# Patient Record
Sex: Male | Born: 1935 | Race: White | Hispanic: No | Marital: Married | Smoking: Never smoker
Health system: Southern US, Community
[De-identification: ages and names within clinical notes are randomized; demographics above are authoritative.]

## PROBLEM LIST (undated history)

## (undated) DIAGNOSIS — J3089 Other allergic rhinitis: Secondary | ICD-10-CM

## (undated) DIAGNOSIS — I2510 Atherosclerotic heart disease of native coronary artery without angina pectoris: Secondary | ICD-10-CM

## (undated) DIAGNOSIS — K219 Gastro-esophageal reflux disease without esophagitis: Secondary | ICD-10-CM

## (undated) DIAGNOSIS — E7800 Pure hypercholesterolemia, unspecified: Secondary | ICD-10-CM

## (undated) HISTORY — PX: PENILE PROSTHESIS IMPLANT: SHX240

## (undated) HISTORY — PX: TURP VAPORIZATION: SUR1397

## (undated) HISTORY — PX: CORONARY ARTERY BYPASS GRAFT: SHX141

## (undated) HISTORY — PX: OTHER SURGICAL HISTORY: SHX169

## (undated) HISTORY — PX: TONSILLECTOMY: SUR1361

## (undated) HISTORY — PX: HERNIA REPAIR: SHX51

---

## 2001-06-14 ENCOUNTER — Encounter: Payer: Self-pay | Admitting: Neurosurgery

## 2001-06-18 ENCOUNTER — Encounter: Payer: Self-pay | Admitting: Neurosurgery

## 2001-06-18 ENCOUNTER — Inpatient Hospital Stay (HOSPITAL_COMMUNITY): Admission: RE | Admit: 2001-06-18 | Discharge: 2001-06-19 | Payer: Self-pay | Admitting: Neurosurgery

## 2003-12-25 ENCOUNTER — Ambulatory Visit: Payer: Self-pay | Admitting: Urology

## 2004-08-27 ENCOUNTER — Ambulatory Visit: Payer: Self-pay | Admitting: Family Medicine

## 2004-10-11 ENCOUNTER — Ambulatory Visit: Payer: Self-pay | Admitting: Internal Medicine

## 2006-11-06 ENCOUNTER — Ambulatory Visit: Payer: Self-pay | Admitting: Internal Medicine

## 2006-11-20 ENCOUNTER — Ambulatory Visit: Payer: Self-pay | Admitting: Internal Medicine

## 2008-10-26 ENCOUNTER — Ambulatory Visit: Payer: Self-pay | Admitting: Internal Medicine

## 2010-08-02 NOTE — Op Note (Signed)
[place]. [facility]  Patient:    Carlos Shepard, Carlos Shepard Visit Number: [id number] MRN: [id number]          Service Type: SUR Location: [id number] Attending Physician:  Donn Pierini Dictated by:   Julio Sicks, M.D. Proc. Date: 06/18/01 Admit Date:  06/18/2001                             Operative Report  PREOPERATIVE DIAGNOSIS:  Left L5-S1 herniated nucleus pulposus with radiculopathy.  POSTOPERATIVE DIAGNOSIS:  Left L5-S1 herniated nucleus pulposus with radiculopathy.  PROCEDURE:  Left L5-S1 laminotomy with microdiskectomy.  SURGEON:  Julio Sicks, M.D.  ASSISTANT:  Reinaldo Meeker, M.D.  ANESTHESIA:  General endotracheal.  INDICATION:  Carlos Shepard is a 77 year old male with history of back and left lower extremity pain, paresthesias, and weakness consistent with a left-sided S1 radiculopathy, which has failed conservative management.  He underwent an MRI scan, which demonstrated some left-sided L5-S1 disk herniation with an inferior fragment causing compression on the left-sided S1 nerve root.  The patient has been counseled as to his options.  He decided to proceed with a left-sided L5-S1 laminotomy and microdiskectomy in hopes of improving his symptoms.  DESCRIPTION OF PROCEDURE:  Patient taken to the operating room and placed on the operating table in a supine position.  After adequate level of anesthesia achieved, patient was positioned prone onto the Wilson frame and appropriately padded.  The patients lumbar region is prepped and draped sterilely.  A 10 blade is used to make a linear incision overlying the L5-S1 interspace.  This is carried down sharply in the midline.  A subperiosteal dissection is performed on the left side, exposing the laminae and facet joints at L5 and S1.  Intraoperative x-ray was taken, and the level was actually the S1-2 level.  Dissection was then redirected one interspace superiorly.  Retractor was placed.   A laminotomy was then performed using the high-speed drill and Kerrison rongeurs to remove the inferior one-third of the lamina of L5 and the medial edge of the L5-S1 facet joint and the superior aspect of the S1 lamina. Ligamentum flavum was then elevated and resected in piecemeal fashion using Kerrison rongeurs.  The underlying thecal sac and exiting S1 nerve root were identified.  The microscope was brought into the field and used for microdissection of the left-sided S1 nerve root and underlying disk herniation.  The epidural venous plexus was coagulated and cut.  The thecal sac and S1 nerve root were gradually mobilized.  A large inferior fragment was then encountered and dissected free.  It was removed with pituitary rongeurs. The disk space was then isolated and incised in a rectangular fashion, and a wide disk space clean-out was achieved using pituitary rongeurs, upward-angled pituitary rongeurs, and Epstein curettes.  All loose or obviously degenerative disk material was removed from the disk space.  All elements of the disk herniation were resected.  There was no evidence of any compression on the thecal sac or nerve roots.  There was no evidence of injury to thecal sac or nerve roots.  The wound was then irrigated with antibiotic solution.  Gelfoam was placed topically for hemostasis, which was found to be good.  The microscope and retractor system were removed.  Hemostasis in the muscle was achieved with electrocautery.  The wound was then closed in layers with Vicryl sutures.  Steri-Strips and a sterile dressing were applied.  There were no complications.  He tolerated the procedure well, and he returns to the recovery room postop. Dictated by:   Julio Sicks, M.D. Attending Physician:  Donn Pierini DD:  06/18/01 TD:  06/19/01 Job: [id number]

## 2010-12-19 ENCOUNTER — Ambulatory Visit: Payer: Self-pay | Admitting: Family Medicine

## 2011-03-26 ENCOUNTER — Ambulatory Visit: Payer: Self-pay | Admitting: Gastroenterology

## 2011-08-13 ENCOUNTER — Ambulatory Visit: Payer: Self-pay | Admitting: Urology

## 2011-08-13 LAB — CBC WITH DIFFERENTIAL/PLATELET
Basophil #: 0.1 10*3/uL (ref 0.0–0.1)
Basophil %: 1.2 %
Eosinophil #: 0.5 10*3/uL (ref 0.0–0.7)
Eosinophil %: 7.5 %
HCT: 44 % (ref 40.0–52.0)
HGB: 14.5 g/dL (ref 13.0–18.0)
Lymphocyte #: 2.4 10*3/uL (ref 1.0–3.6)
Lymphocyte %: 39.3 %
MCH: 31.6 pg (ref 26.0–34.0)
MCHC: 32.9 g/dL (ref 32.0–36.0)
MCV: 96 fL (ref 80–100)
Monocyte #: 0.4 x10 3/mm (ref 0.2–1.0)
Monocyte %: 7.1 %
Neutrophil #: 2.8 10*3/uL (ref 1.4–6.5)
Neutrophil %: 44.9 %
Platelet: 213 10*3/uL (ref 150–440)
RBC: 4.59 10*6/uL (ref 4.40–5.90)
RDW: 13.8 % (ref 11.5–14.5)
WBC: 6.2 10*3/uL (ref 3.8–10.6)

## 2011-08-13 LAB — BASIC METABOLIC PANEL
Anion Gap: 6 — ABNORMAL LOW (ref 7–16)
BUN: 17 mg/dL (ref 7–18)
Calcium, Total: 8.9 mg/dL (ref 8.5–10.1)
Chloride: 104 mmol/L (ref 98–107)
Co2: 31 mmol/L (ref 21–32)
Creatinine: 0.8 mg/dL (ref 0.60–1.30)
EGFR (African American): >60
EGFR (Non-African Amer.): >60
Glucose: 76 mg/dL (ref 65–99)
Osmolality: 282 (ref 275–301)
Potassium: 4.6 mmol/L (ref 3.5–5.1)
Sodium: 141 mmol/L (ref 136–145)

## 2011-08-18 ENCOUNTER — Ambulatory Visit: Payer: Self-pay | Admitting: Urology

## 2011-08-19 LAB — PATHOLOGY REPORT

## 2013-02-24 ENCOUNTER — Ambulatory Visit: Payer: Self-pay | Admitting: Otolaryngology

## 2013-11-07 ENCOUNTER — Ambulatory Visit: Payer: Self-pay | Admitting: Ophthalmology

## 2014-07-08 NOTE — Op Note (Signed)
PATIENT NAME:  [name], Carlos R MR#:  [id number] DATE OF BIRTH:  Feb 19, 1934  DATE OF PROCEDURE:  11/07/2013  PREOPERATIVE DIAGNOSIS:  Cataract, left eye.    POSTOPERATIVE DIAGNOSIS:  Cataract, left eye.  PROCEDURE PERFORMED:  Extracapsular cataract extraction using phacoemulsification with placement of an Alcon SN60WF, 24.5-diopter posterior chamber lens, serial #[id number].  SURGEON:  Maylon PeppersSteven A. Deagen Krass, MD  ASSISTANT:  None.  ANESTHESIA: 4% lidocaine and 0.75% Marcaine in a 50/50 mixture with 10 units per milliliter of Hylenex added, given as a peribulbar.  ANESTHESIOLOGIST: Dr. Darleene CleaverVan Staveren.   COMPLICATIONS:  None.  ESTIMATED BLOOD LOSS:  Less than 1 ml.  DESCRIPTION OF PROCEDURE:  The patient was brought to the operating room and given a peribulbar block.  The patient was then prepped and draped in the usual fashion.  The vertical rectus muscles were imbricated using 5-0 silk sutures.  These sutures were then clamped to the sterile drapes as bridle sutures.  A limbal peritomy was performed extending two clock hours and hemostasis was obtained with cautery.  A partial thickness scleral groove was made at the surgical limbus and dissected anteriorly in a lamellar dissection using an Alcon crescent knife.  The anterior chamber was entered supero-temporally with a Superblade and through the lamellar dissection with a 2.6 mm keratome.  DisCoVisc was used to replace the aqueous and a continuous tear capsulorrhexis was carried out.  Hydrodissection and hydrodelineation were carried out with balanced salt and a 27 gauge canula.  The nucleus was rotated to confirm the effectiveness of the hydrodissection.  Phacoemulsification was carried out using a divide-and-conquer technique.  Total ultrasound time was 1 minute and 37 seconds with an average power of 22 percent. CDE of 37.89. No suture was placed.   Irrigation/aspiration was used to remove the residual cortex.  DisCoVisc was used to  inflate the capsule and the internal incision was enlarged to 3 mm with the crescent knife.  The intraocular lens was folded and inserted into the capsular bag using the AcrySert delivery system.  Irrigation/aspiration was used to remove the residual DisCoVisc.  Miostat was injected into the anterior chamber through the paracentesis track to inflate the anterior chamber and induce miosis.  A tenth of a milliliter of cefuroxime was injected via the paracentesis track containing 1 mg of drug. The wound was checked for leaks and none were found. The conjunctiva was closed with cautery and the bridle sutures were removed.  Two drops of 0.3% Vigamox were placed on the eye.   An eye shield was placed on the eye.  The patient was discharged to the recovery room in good condition.   ____________________________ Maylon PeppersSteven A. Doryan Bahl, MD sad:lt D: 11/07/2013 13:38:50 ET T: 11/07/2013 23:49:03 ET JOB#: [id number]  cc: Viviann SpareSteven A. Eliya Bubar, MD, <Dictator> Erline LevineSTEVEN A Kasson Lamere MD ELECTRONICALLY SIGNED 11/14/2013 13:36

## 2014-07-09 NOTE — H&P (Signed)
PATIENT NAME:  Carlos Shepard, Carlos R MR#:  [id number] DATE OF BIRTH:  November 22, 1933  DATE OF ADMISSION:  08/18/2011  CHIEF COMPLAINT: Right inguinal hernia.   HISTORY OF PRESENT ILLNESS: Carlos Shepard is a 81 year old Caucasian male who presented with right groin discomfort particularly with physical exertion and periodic swelling. He was found to have an easily reducible right inguinal hernia. He comes in now for right inguinal hernia repair. The patient does have history of inflatable penile prosthesis placement and the reservoir is located in the right retropubic space. The prosthesis is functioning normally.   ALLERGIES: No drug allergies.   CURRENT MEDICATIONS: Aspirin, fexofenadine, Lipitor, and omeprazole.   PAST SURGICAL HISTORY:  1. Transurethral resection of the prostate 1993.  2. Coronary bypass graft 2001.  3. Lumbar disk repair 2003.  4. Tonsillectomy as a child.  5. Inflatable penile prosthesis placement 2003.   SOCIAL HISTORY: The patient denied tobacco or alcohol use.   FAMILY HISTORY: Remarkable for stroke.  PAST AND CURRENT MEDICAL CONDITIONS:  1. Coronary artery disease.  2. Allergic rhinitis.  3. Hyperlipidemia.  4. Gastroesophageal reflux disease.   REVIEW OF SYSTEMS: The patient denies chest pain, shortness of breath, diabetes, or stroke.   PHYSICAL EXAMINATION:  GENERAL: Well-nourished white male in no acute distress.   HEENT: Sclerae were clear. Pupils were equally round and reactive to light and accommodation. Extraocular movements were intact.   NECK: Supple. No palpable cervical adenopathy. No audible carotid bruits.   LUNGS: Clear to auscultation.   CARDIOVASCULAR: Regular rhythm and rate without audible murmurs or gallops.   ABDOMEN: Soft, nontender abdomen.   GENITOURINARY: The patient was circumcised. Penile prosthesis components were in good position and easily cycled. He had an easily reducible right inguinal hernia.   RECTAL: 20-gram, smooth,  nontender prostate.   NEUROMUSCULAR: Alert and oriented times three.   IMPRESSION:  1. Right inguinal hernia.  2. Status post IPP placement.   PLAN: Right inguinal herniorrhaphy.    ____________________________ Suszanne ConnersMichael R. Evelene CroonWolff, MD mrw:bjt D: 08/13/2011 10:08:13 ET T: 08/13/2011 10:41:20 ET JOB#: [id number]  cc: Suszanne ConnersMichael R. Evelene CroonWolff, MD, <Dictator> Orson ApeMICHAEL R Maryah Marinaro MD ELECTRONICALLY SIGNED 08/14/2011 10:02

## 2014-07-09 NOTE — Op Note (Signed)
PATIENT NAME:  Carlos Shepard, Carlos R MR#:  [id number] DATE OF BIRTH:  1933-03-22  DATE OF PROCEDURE:  08/18/2011  PREOPERATIVE DIAGNOSIS: Right inguinal hernia.   POSTOPERATIVE DIAGNOSIS: Right inguinal hernia.   PROCEDURES PERFORMED: 1. Right inguinal hernia repair.  2. Right spermatic cord block.   SURGEON: Anola GurneyMichael Wolff, MD   ANESTHETISTS: Dr. Maisie Fushomas and Dr. Evelene Shepard.   ANESTHETIC METHOD: General per Dr. Maisie Fushomas and spermatic cord block per Dr. Evelene Shepard.   INDICATIONS: See the dictated history and physical. After informed consent, the patient requests the above procedure.   OPERATIVE SUMMARY: After adequate general anesthesia had been obtained, the perineum and lower abdomen were prepped and draped in the usual fashion. Penile prosthesis was fully deflated. A right inguinal crease incision was made and carried down sharply through the skin and through the subcutaneous fatty tissue with electrocautery. Spermatic cord and external oblique fibers were identified. The external oblique fibers were opened along their course further exposing the spermatic cord. The hernia sac was identified and separated from the cord structures. The reservoir tubing was also identified. The hernia sac was then dissected back to the internal ring and ligated with 2-0 Surgilon. Redundant sac was excised and submitted to pathology. The retropubic space was then developed using blunt finger dissection. The retropubic space was irrigated with GU irrigant. The Ethicon PSE graft was selected and circular portion opened in the retropubic space. The oblong portion was then placed parallel to the external oblique fibers beneath the external oblique. A keyhole incision was made laterally to accommodate the spermatic cord and this portion of the graft was brought around the cord and then the edges anchored to the inguinal ligament with 2-0 Surgilon. The distal end of the oblong section of the graft was then back anchored to the pubic  tubercle with 2-0 Surgilon suture. The spermatic cord was then placed back into its normal anatomic position. Spermatic cord block was performed with 0.25% Marcaine. The external oblique fascia was then reapproximated with interrupted 2-0 Surgilon suture. Scarpa's fascia was reapproximated with interrupted 3-0 plain catgut and the skin was closed with 4-0 Vicryl subcuticular closure. Benzoin, Steri-Strips, and sterile dressing were applied. Sponge, needle, and instrument counts were noted to be correct. The procedure was then terminated and the patient was transferred to the recovery room in stable condition. ____________________________ Suszanne ConnersMichael R. Evelene CroonWolff, MD mrw:slb D: 08/18/2011 11:32:16 ET T: 08/18/2011 11:44:57 ET JOB#: [id number]  cc: Suszanne ConnersMichael R. Evelene CroonWolff, MD, <Dictator> Orson ApeMICHAEL R WOLFF MD ELECTRONICALLY SIGNED 08/18/2011 13:27

## 2015-01-01 ENCOUNTER — Other Ambulatory Visit: Payer: Self-pay | Admitting: Specialist

## 2015-01-01 DIAGNOSIS — R911 Solitary pulmonary nodule: Secondary | ICD-10-CM

## 2015-01-10 ENCOUNTER — Ambulatory Visit
Admission: RE | Admit: 2015-01-10 | Discharge: 2015-01-10 | Disposition: A | Discharge status: Home / Self Care | Payer: Medicare Other | Source: Ambulatory Visit | Attending: Specialist | Admitting: Specialist

## 2015-01-10 DIAGNOSIS — R911 Solitary pulmonary nodule: Secondary | ICD-10-CM

## 2015-01-10 DIAGNOSIS — Z951 Presence of aortocoronary bypass graft: Secondary | ICD-10-CM | POA: Diagnosis not present

## 2015-01-10 DIAGNOSIS — A319 Mycobacterial infection, unspecified: Secondary | ICD-10-CM | POA: Diagnosis not present

## 2015-01-10 DIAGNOSIS — I2510 Atherosclerotic heart disease of native coronary artery without angina pectoris: Secondary | ICD-10-CM | POA: Insufficient documentation

## 2015-01-10 DIAGNOSIS — J219 Acute bronchiolitis, unspecified: Secondary | ICD-10-CM | POA: Insufficient documentation

## 2016-10-03 IMAGING — CT CT CHEST W/O CM
2 of 3 series · 15 of 36 positions shown, 18 images · non-contrast
Comparison: 08/27/2004 chest CT. Report from 12/03/2014 chest
radiograph (images not available).

CLINICAL DATA: Follow-up nodular densities seen in the right upper
lung on recent chest radiograph. No current symptoms are reported.

EXAM:
CT CHEST WITHOUT CONTRAST
TECHNIQUE: Multidetector CT imaging of the chest was performed following the
standard protocol without IV contrast.

[Series 2: routine chest wo · axial · 0.68mm/px · z∈[-841,-526]mm · 12 of 75 slices shown, 15 images]
[im 6/75  mediastinal]
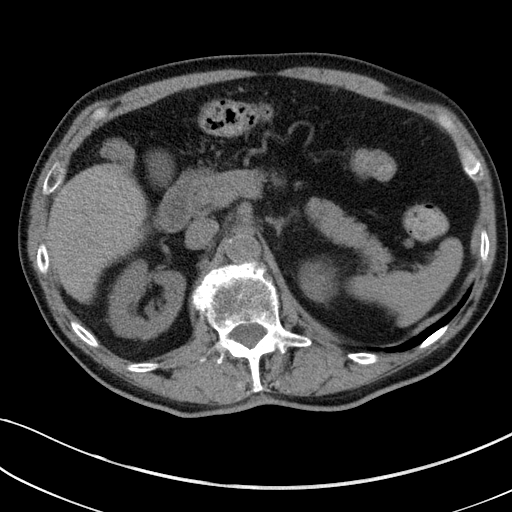
[im 6/75  lung]
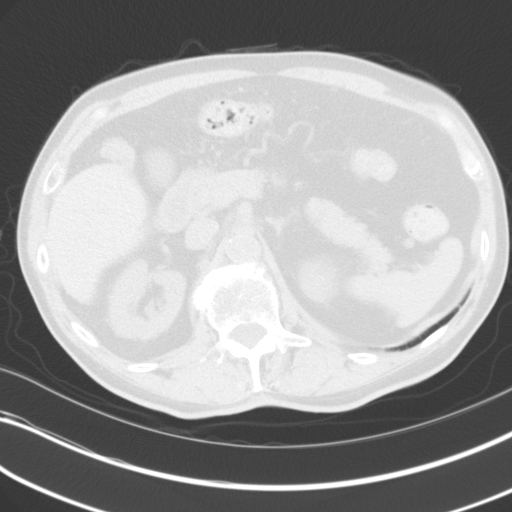
[im 11/75  lung]
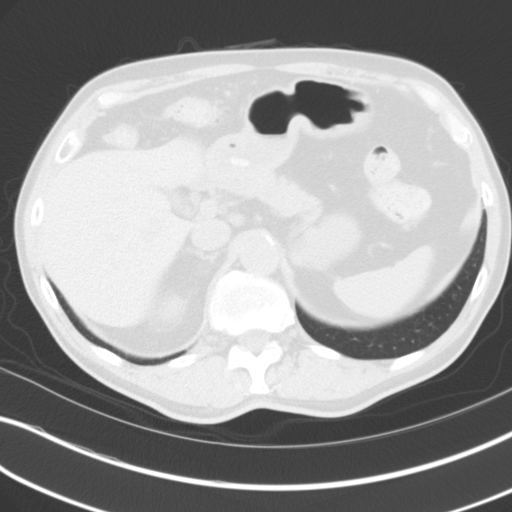
[im 17/75  lung]
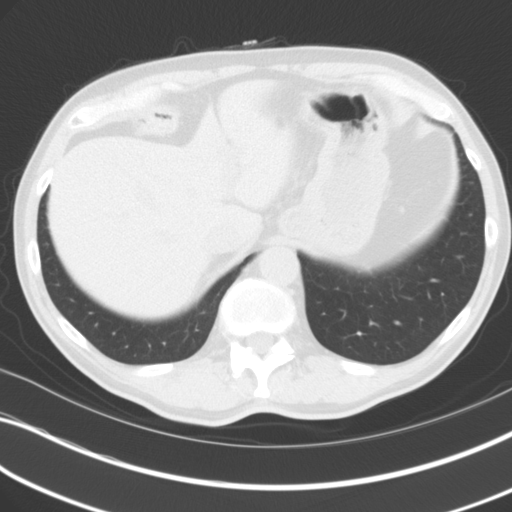
[im 22/75  lung]
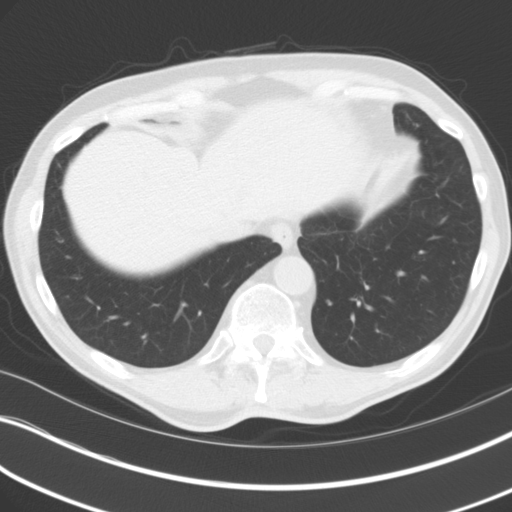
[im 28/75  mediastinal]
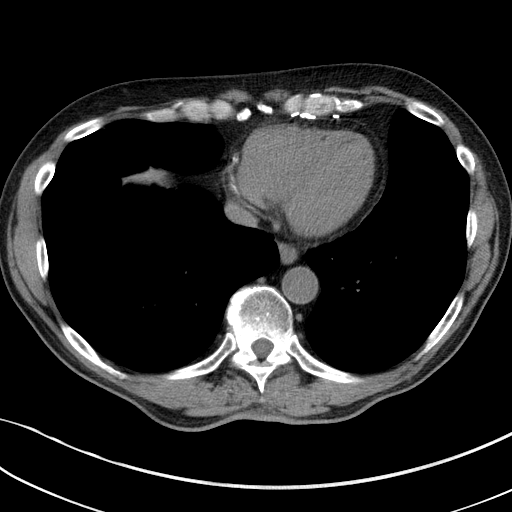
[im 28/75  lung]
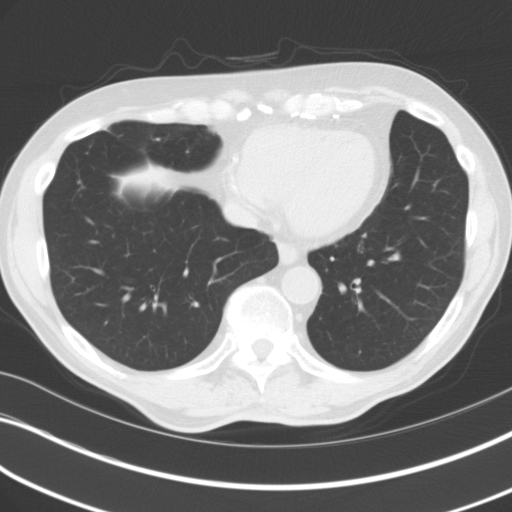
[im 33/75  lung]
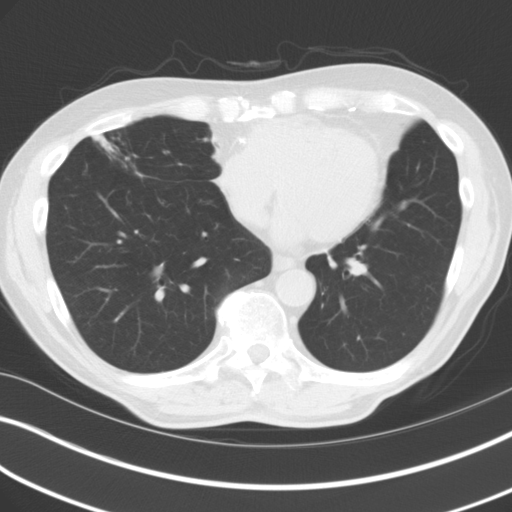
[im 42/75  lung]
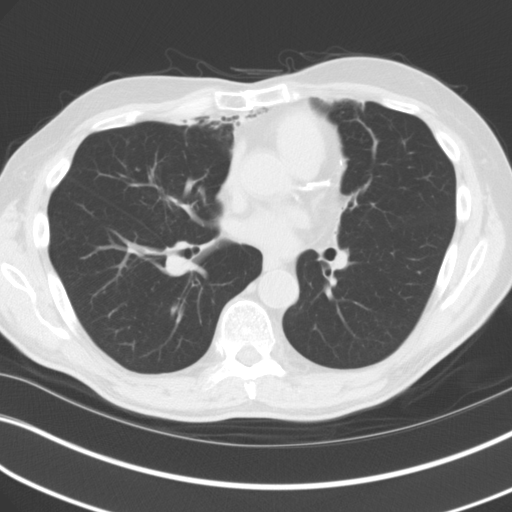
[im 47/75  lung]
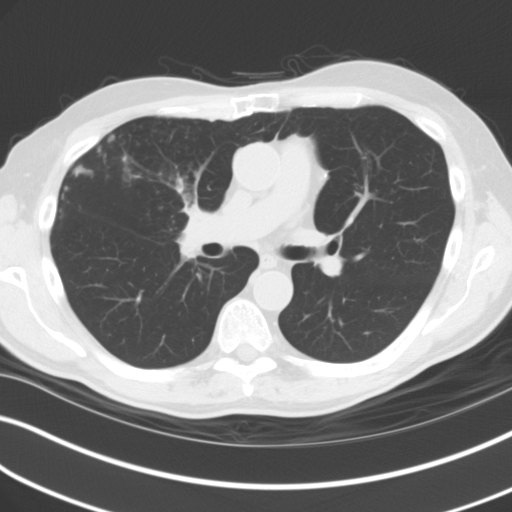
[im 53/75  mediastinal]
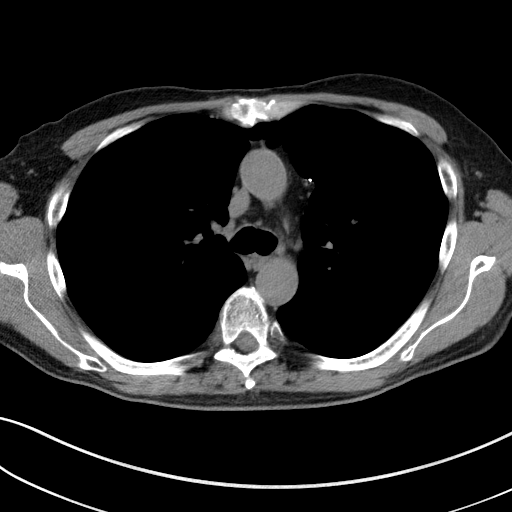
[im 53/75  lung]
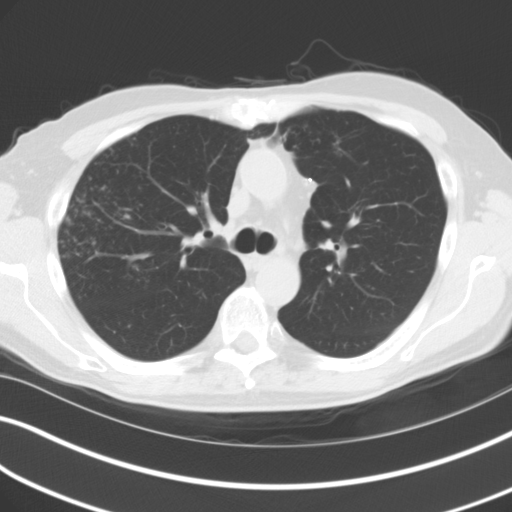
[im 58/75  lung]
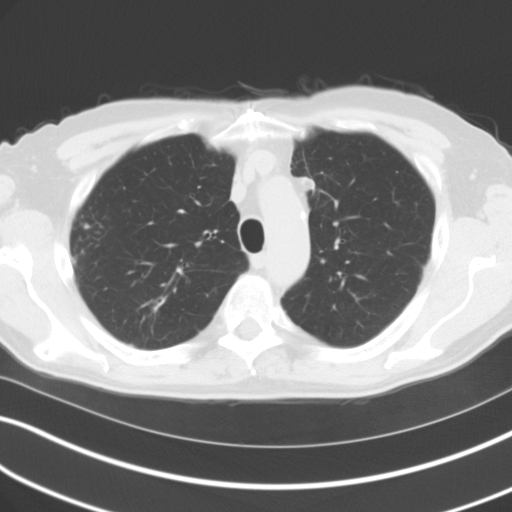
[im 64/75  lung]
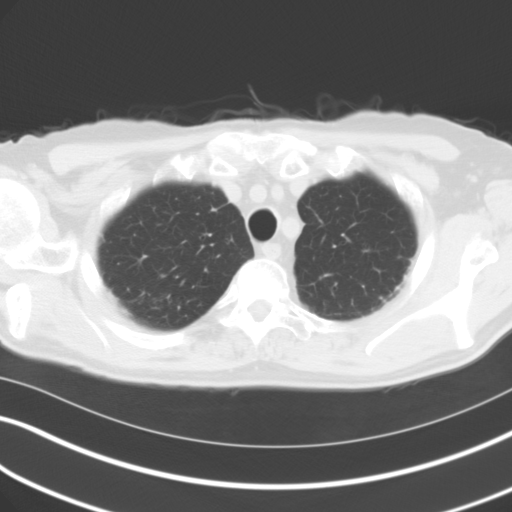
[im 69/75  lung]
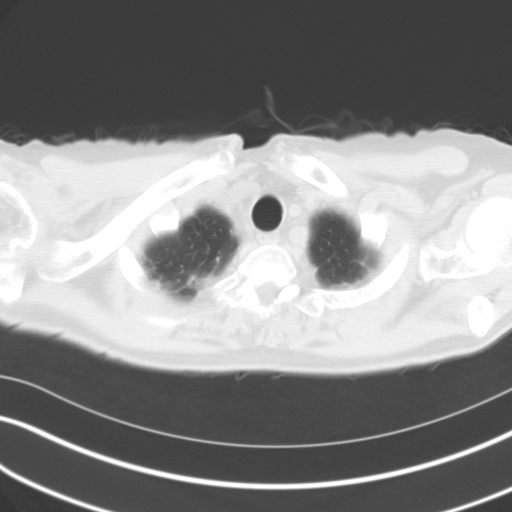

[Series 5: cor routine chest wo · coronal · 0.71mm/px · 3 of 120 slices shown]
[im 24/120  lung]
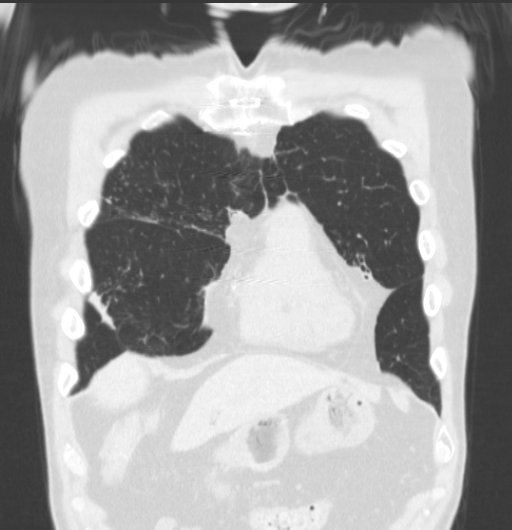
[im 48/120  lung]
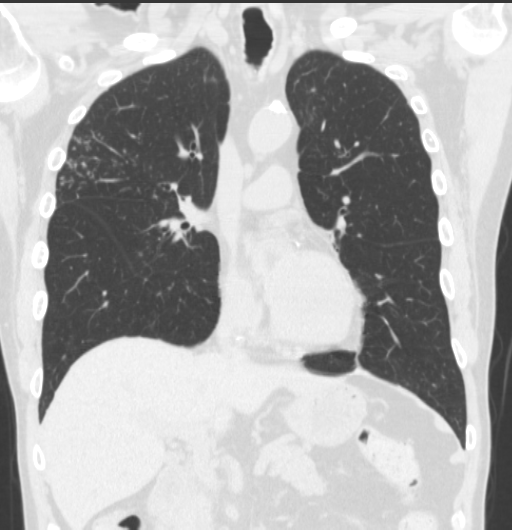
[im 72/120  lung]
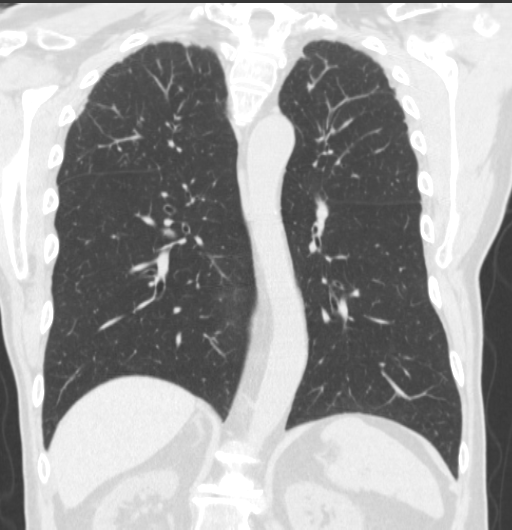

[15 of 36 positions shown; findings below may reference images not displayed]

FINDINGS: Mediastinum/Nodes: Normal heart size. No pericardial
fluid/thickening. There is atherosclerosis of the thoracic aorta,
the great vessels of the mediastinum and the coronary arteries,
including calcified atherosclerotic plaque in the left anterior
descending, left circumflex and right coronary arteries. Left
internal mammary coronary bypass graft is noted. Great vessels are
normal in course and caliber. Normal visualized thyroid. Normal
esophagus. No pathologically enlarged axillary, mediastinal or gross
hilar lymph nodes, noting limited sensitivity for the detection of
hilar adenopathy on this noncontrast study.

Lungs/Pleura: No pneumothorax. No pleural effusion. There is mild to
moderate cylindrical bronchiectasis throughout both lungs, most
prominent in the right middle lobe, basilar right upper lobe and
medial lingula. There is associated prominent patchy tree-in-bud
opacity throughout the peripheral and basilar right upper lobe,
right middle lobe and medial lingula. There is associated volume
loss, architectural distortion and subpleural consolidation in the
medial and lateral right middle lobe, basilar right lower lobe and
medial lingula, in keeping with chronic postinfectious scarring. No
lung mass.

Upper abdomen: Unremarkable.

Musculoskeletal: No aggressive appearing focal osseous lesions. Mild
degenerative changes in the thoracic spine. Hemangioma in the T12
vertebral body. Median sternotomy wires are intact.
IMPRESSION: 1. Findings of chronic infectious bronchiolitis due to atypical
mycobacterial infection (HP), including prominent tree-in-bud
opacity, mild-to-moderate cylindrical bronchiectasis and peripheral
foci of postinfectious scarring, most prominently involving the
peripheral and basilar right upper lobe, right middle lobe and
medial lingula.
2. Three-vessel coronary artery calcifications status post CABG .

## 2016-12-02 ENCOUNTER — Encounter: Payer: Self-pay | Admitting: Internal Medicine

## 2017-11-17 ENCOUNTER — Other Ambulatory Visit: Payer: Self-pay

## 2017-11-17 ENCOUNTER — Encounter
Admission: RE | Admit: 2017-11-17 | Discharge: 2017-11-17 | Disposition: A | Discharge status: Home / Self Care | Payer: Medicare Other | Source: Ambulatory Visit | Attending: Urology | Admitting: Urology

## 2017-11-17 DIAGNOSIS — Z01812 Encounter for preprocedural laboratory examination: Secondary | ICD-10-CM | POA: Insufficient documentation

## 2017-11-17 DIAGNOSIS — Z01810 Encounter for preprocedural cardiovascular examination: Secondary | ICD-10-CM | POA: Insufficient documentation

## 2017-11-17 DIAGNOSIS — I2510 Atherosclerotic heart disease of native coronary artery without angina pectoris: Secondary | ICD-10-CM | POA: Diagnosis not present

## 2017-11-17 HISTORY — DX: Atherosclerotic heart disease of native coronary artery without angina pectoris: I25.10

## 2017-11-17 HISTORY — DX: Gastro-esophageal reflux disease without esophagitis: K21.9

## 2017-11-17 HISTORY — DX: Other allergic rhinitis: J30.89

## 2017-11-17 HISTORY — DX: Pure hypercholesterolemia, unspecified: E78.00

## 2017-11-17 LAB — CBC
HEMATOCRIT: 42.9 % (ref 40.0–52.0)
Hemoglobin: 14.3 g/dL (ref 13.0–18.0)
MCH: 31.4 pg (ref 26.0–34.0)
MCHC: 33.3 g/dL (ref 32.0–36.0)
MCV: 94 fL (ref 80.0–100.0)
PLATELETS: 236 10*3/uL (ref 150–440)
RBC: 4.57 MIL/uL (ref 4.40–5.90)
RDW: 14.3 % (ref 11.5–14.5)
WBC: 6.4 10*3/uL (ref 3.8–10.6)

## 2017-11-17 LAB — BASIC METABOLIC PANEL
Anion gap: 4 — ABNORMAL LOW (ref 5–15)
BUN: 18 mg/dL (ref 8–23)
CALCIUM: 9.1 mg/dL (ref 8.9–10.3)
CO2: 30 mmol/L (ref 22–32)
Chloride: 105 mmol/L (ref 98–111)
Creatinine, Ser: 0.98 mg/dL (ref 0.61–1.24)
GFR calc Af Amer: >60 mL/min (ref >60)
GLUCOSE: 90 mg/dL (ref 70–99)
POTASSIUM: 4.4 mmol/L (ref 3.5–5.1)
Sodium: 139 mmol/L (ref 135–145)

## 2017-11-17 NOTE — Patient Instructions (Signed)
Your procedure is scheduled on: 11/24/17 Tues Report to Same Day Surgery 2nd floor medical mall [facility] Entrance-take elevator on left to 2nd floor.  Check in with surgery information desk.) To find out your arrival time please call [phone] between 1PM - 3PM on 11/23/17 Mon  Remember: Instructions that are not followed completely may result in serious medical risk, up to and including death, or upon the discretion of your surgeon and anesthesiologist your surgery may need to be rescheduled.    _x___ 1. Do not eat food after midnight the night before your procedure. You may drink clear liquids up to 2 hours before you are scheduled to arrive at the hospital for your procedure.  Do not drink clear liquids within 2 hours of your scheduled arrival to the hospital.  Clear liquids include  --Water or Apple juice without pulp  --Clear carbohydrate beverage such as ClearFast or Gatorade  --Black Coffee or Clear Tea (No milk, no creamers, do not add anything to                  the coffee or Tea Type 1 and type 2 diabetics should only drink water.   ____Ensure clear carbohydrate drink on the way to the hospital for bariatric patients  ____Ensure clear carbohydrate drink 3 hours before surgery for Dr Rutherford Nail patients if physician instructed.   No gum chewing or hard candies.     __x__ 2. No Alcohol for 24 hours before or after surgery.   __x__3. No Smoking or e-cigarettes for 24 prior to surgery.  Do not use any chewable tobacco products for at least 6 hour prior to surgery   ____  4. Bring all medications with you on the day of surgery if instructed.    __x__ 5. Notify your doctor if there is any change in your medical condition     (cold, fever, infections).    x___6. On the morning of surgery brush your teeth with toothpaste and water.  You may rinse your mouth with mouth wash if you wish.  Do not swallow any toothpaste or mouthwash.   Do not wear jewelry, make-up, hairpins,  clips or nail polish.  Do not wear lotions, powders, or perfumes. You may wear deodorant.  Do not shave 48 hours prior to surgery. Men may shave face and neck.  Do not bring valuables to the hospital.    [facility] is not responsible for any belongings or valuables.               Contacts, dentures or bridgework may not be worn into surgery.  Leave your suitcase in the car. After surgery it may be brought to your room.  For patients admitted to the hospital, discharge time is determined by your                       treatment team.  _  Patients discharged the day of surgery will not be allowed to drive home.  You will need someone to drive you home and stay with you the night of your procedure.    Please read over the following fact sheets that you were given:   [facility] Preparing for Surgery and or MRSA Information   _x___ Take anti-hypertensive listed below, cardiac, seizure, asthma,     anti-reflux and psychiatric medicines. These include:  1. omeprazole (PRILOSEC) 20 MG capsule  2.  3.  4.  5.  6.  ____Fleets enema or  Magnesium Citrate as directed.   _x___ Use CHG Soap or sage wipes as directed on instruction sheet   ____ Use inhalers on the day of surgery and bring to hospital day of surgery  ____ Stop Metformin and Janumet 2 days prior to surgery.    ____ Take 1/2 of usual insulin dose the night before surgery and none on the morning     surgery.   _x___ Follow recommendations from Cardiologist, Pulmonologist or PCP regarding          stopping Aspirin, Coumadin, Plavix ,Eliquis, Effient, or Pradaxa, and Pletal.  X____Stop Anti-inflammatories such as Advil, Aleve, Ibuprofen, Motrin, Naproxen, Naprosyn, Goodies powders or aspirin products. OK to take Tylenol and                          Celebrex.   _x___ Stop supplements until after surgery.  But may continue Vitamin D, Vitamin B,       and multivitamin.   ____ Bring C-Pap to the hospital.

## 2017-11-18 NOTE — H&P (Signed)
Carlos Shepard, Carlos Shepard MEDICAL RECORD [id number] ACCOUNT [id number] DATE OF BIRTH:1934-02-15 FACILITY: [facility] LOCATION: [facility] PHYSICIAN:Nickalos Petersen Gilles Chiquito, MD  HISTORY AND PHYSICAL  DATE OF ADMISSION:  11/24/2017  CHIEF COMPLAINT:  Right scrotal swelling and discomfort.  HISTORY OF PRESENT ILLNESS:  The patient is an 84 year old white male who presented with a 34-month history of right scrotal swelling and discomfort.  He was found to have a hydrocele.  The hydrocele was treated with drainage and sclerotherapy.  The  hydrocele has since recurred, and he comes in now for right hydrocelectomy.  ALLERGIES:  No drug allergies.  CURRENT MEDICATIONS:  Included Lipitor, fexofenadine, omeprazole, aspirin and Flonase.  PAST SURGICAL HISTORY: 1.  Transurethral resection of prostate in 1993. 2.  Coronary artery bypass graft 2001. 3.  Lumbar disk repair in 2003. 4.  Tonsillectomy as a child. 5.  Inflatable penile prosthesis placement in 2003. 6.  Right inguinal herniorrhaphy 2013.  PAST AND CURRENT MEDICAL CONDITIONS: 1.  Coronary artery disease. 2.  Allergic rhinitis. 3.  Hyperlipidemia. 4.  GERD.  REVIEW OF SYSTEMS:  The patient denies chest pain, shortness of breath, diabetes or stroke.  PHYSICAL EXAMINATION: GENERAL:  Well-nourished white male in no acute distress. HEENT:  Sclerae were clear.  Pupils are equally round, reactive to light and accommodation.  Extraocular movements are intact. NECK:  No palpable cervical adenopathy. PULMONARY:  Lungs clear to auscultation. CARDIOVASCULAR:  Regular rhythm and rate without audible murmurs. ABDOMEN:  Soft, nontender abdomen. GENITOURINARY:  Circumcised.  Penile prosthesis components were in good position and functioned well.  He had a transilluminating right hydrocele measuring approximately 75 mL in size.  Testes were smooth and nontender. NEUROMUSCULAR:  Grossly intact.  IMPRESSION:  Symptomatic and recurrent right  hydrocele.  PLAN:  Right hydrocelectomy.  LN/NUANCE  D:11/17/2017 T:11/17/2017 JOB:[id number]

## 2017-11-24 ENCOUNTER — Ambulatory Visit: Payer: Medicare Other | Admitting: Anesthesiology

## 2017-11-24 ENCOUNTER — Other Ambulatory Visit: Payer: Self-pay

## 2017-11-24 ENCOUNTER — Encounter
Admission: RE | Disposition: A | Discharge status: Home / Self Care | Payer: Self-pay | Source: Ambulatory Visit | Attending: Urology

## 2017-11-24 ENCOUNTER — Encounter: Payer: Self-pay | Admitting: Emergency Medicine

## 2017-11-24 ENCOUNTER — Ambulatory Visit
Admission: RE | Admit: 2017-11-24 | Discharge: 2017-11-24 | Disposition: A | Discharge status: Home / Self Care | Payer: Medicare Other | Source: Ambulatory Visit | Attending: Urology | Admitting: Urology

## 2017-11-24 DIAGNOSIS — Z9689 Presence of other specified functional implants: Secondary | ICD-10-CM | POA: Diagnosis not present

## 2017-11-24 DIAGNOSIS — Z79899 Other long term (current) drug therapy: Secondary | ICD-10-CM | POA: Diagnosis not present

## 2017-11-24 DIAGNOSIS — Z7951 Long term (current) use of inhaled steroids: Secondary | ICD-10-CM | POA: Insufficient documentation

## 2017-11-24 DIAGNOSIS — J309 Allergic rhinitis, unspecified: Secondary | ICD-10-CM | POA: Diagnosis not present

## 2017-11-24 DIAGNOSIS — K219 Gastro-esophageal reflux disease without esophagitis: Secondary | ICD-10-CM | POA: Diagnosis not present

## 2017-11-24 DIAGNOSIS — Z951 Presence of aortocoronary bypass graft: Secondary | ICD-10-CM | POA: Insufficient documentation

## 2017-11-24 DIAGNOSIS — N433 Hydrocele, unspecified: Secondary | ICD-10-CM | POA: Diagnosis not present

## 2017-11-24 DIAGNOSIS — E785 Hyperlipidemia, unspecified: Secondary | ICD-10-CM | POA: Diagnosis not present

## 2017-11-24 DIAGNOSIS — I2510 Atherosclerotic heart disease of native coronary artery without angina pectoris: Secondary | ICD-10-CM | POA: Insufficient documentation

## 2017-11-24 DIAGNOSIS — Z7982 Long term (current) use of aspirin: Secondary | ICD-10-CM | POA: Insufficient documentation

## 2017-11-24 DIAGNOSIS — E7800 Pure hypercholesterolemia, unspecified: Secondary | ICD-10-CM | POA: Diagnosis not present

## 2017-11-24 HISTORY — PX: HYDROCELE EXCISION: SHX482

## 2017-11-24 SURGERY — HYDROCELECTOMY
Anesthesia: General | Site: Groin | Laterality: Right

## 2017-11-24 MED ORDER — CEPHALEXIN 500 MG PO CAPS: 500.0000 mg | ORAL_CAPSULE | Freq: Four times a day (QID) | ORAL | 0 refills | Status: AC

## 2017-11-24 MED ORDER — LACTATED RINGERS IV SOLN
INTRAVENOUS | Status: DC
  Administered 2017-11-24: 12:00:00 via INTRAVENOUS

## 2017-11-24 MED ORDER — BUPIVACAINE HCL 0.5 % IJ SOLN
INTRAMUSCULAR | Status: DC | PRN
  Administered 2017-11-24: 3.5 mL

## 2017-11-24 MED ORDER — ACETAMINOPHEN-CODEINE #3 300-30 MG PO TABS: 1.0000 | ORAL_TABLET | ORAL | 2 refills | Status: AC | PRN

## 2017-11-24 MED ORDER — LIDOCAINE HCL 1 % IJ SOLN
INTRAMUSCULAR | Status: DC | PRN
  Administered 2017-11-24: 3.5 mL

## 2017-11-24 MED ORDER — DEXAMETHASONE SODIUM PHOSPHATE 10 MG/ML IJ SOLN
INTRAMUSCULAR | Status: AC
  Filled 2017-11-24: qty 1

## 2017-11-24 MED ORDER — PROPOFOL 10 MG/ML IV BOLUS
INTRAVENOUS | Status: DC | PRN
  Administered 2017-11-24: 130 mg via INTRAVENOUS

## 2017-11-24 MED ORDER — CEFAZOLIN SODIUM-DEXTROSE 1-4 GM/50ML-% IV SOLN
1.0000 g | Freq: Once | INTRAVENOUS | Status: AC
  Administered 2017-11-24: 1 g via INTRAVENOUS

## 2017-11-24 MED ORDER — ACETAMINOPHEN 10 MG/ML IV SOLN
INTRAVENOUS | Status: DC | PRN
  Administered 2017-11-24: 1000 mg via INTRAVENOUS

## 2017-11-24 MED ORDER — ACETAMINOPHEN 10 MG/ML IV SOLN
INTRAVENOUS | Status: AC
  Filled 2017-11-24: qty 100

## 2017-11-24 MED ORDER — PROPOFOL 10 MG/ML IV BOLUS
INTRAVENOUS | Status: AC
  Filled 2017-11-24: qty 20

## 2017-11-24 MED ORDER — GLYCOPYRROLATE 0.2 MG/ML IJ SOLN
INTRAMUSCULAR | Status: DC | PRN
  Administered 2017-11-24: 0.2 mg via INTRAVENOUS

## 2017-11-24 MED ORDER — ONDANSETRON HCL 4 MG/2ML IJ SOLN
INTRAMUSCULAR | Status: DC | PRN
  Administered 2017-11-24: 4 mg via INTRAVENOUS

## 2017-11-24 MED ORDER — FENTANYL CITRATE (PF) 100 MCG/2ML IJ SOLN
INTRAMUSCULAR | Status: DC | PRN
  Administered 2017-11-24: 50 ug via INTRAVENOUS
  Administered 2017-11-24 (×2): 25 ug via INTRAVENOUS

## 2017-11-24 MED ORDER — LIDOCAINE HCL (PF) 2 % IJ SOLN
INTRAMUSCULAR | Status: AC
  Filled 2017-11-24: qty 10

## 2017-11-24 MED ORDER — LIDOCAINE HCL (CARDIAC) PF 100 MG/5ML IV SOSY
PREFILLED_SYRINGE | INTRAVENOUS | Status: DC | PRN
  Administered 2017-11-24: 80 mg via INTRAVENOUS

## 2017-11-24 MED ORDER — FENTANYL CITRATE (PF) 250 MCG/5ML IJ SOLN
INTRAMUSCULAR | Status: AC
  Filled 2017-11-24: qty 5

## 2017-11-24 MED ORDER — LIDOCAINE HCL (PF) 1 % IJ SOLN
INTRAMUSCULAR | Status: AC
  Filled 2017-11-24: qty 30

## 2017-11-24 MED ORDER — BUPIVACAINE HCL (PF) 0.5 % IJ SOLN
INTRAMUSCULAR | Status: AC
  Filled 2017-11-24: qty 30

## 2017-11-24 MED ORDER — OXYCODONE HCL 5 MG PO TABS: 5.0000 mg | ORAL_TABLET | Freq: Once | ORAL | Status: DC | PRN

## 2017-11-24 MED ORDER — ONDANSETRON HCL 4 MG/2ML IJ SOLN
INTRAMUSCULAR | Status: AC
  Filled 2017-11-24: qty 2

## 2017-11-24 MED ORDER — FENTANYL CITRATE (PF) 100 MCG/2ML IJ SOLN: 25.0000 ug | INTRAMUSCULAR | Status: DC | PRN

## 2017-11-24 MED ORDER — MIDAZOLAM HCL 2 MG/2ML IJ SOLN
INTRAMUSCULAR | Status: AC
  Filled 2017-11-24: qty 2

## 2017-11-24 MED ORDER — CEFAZOLIN SODIUM-DEXTROSE 1-4 GM/50ML-% IV SOLN
INTRAVENOUS | Status: AC
  Filled 2017-11-24: qty 50

## 2017-11-24 MED ORDER — DEXAMETHASONE SODIUM PHOSPHATE 10 MG/ML IJ SOLN
INTRAMUSCULAR | Status: DC | PRN
  Administered 2017-11-24: 10 mg via INTRAVENOUS

## 2017-11-24 SURGICAL SUPPLY — 28 items
BLADE CLIPPER SURG (BLADE) ×7 IMPLANT
BLADE SURG 15 STRL LF DISP TIS (BLADE) ×1 IMPLANT
BLADE SURG 15 STRL SS (BLADE) ×2
DERMABOND ADVANCED (GAUZE/BANDAGES/DRESSINGS) ×2
DERMABOND ADVANCED .7 DNX12 (GAUZE/BANDAGES/DRESSINGS) ×1 IMPLANT
DRAPE LAPAROTOMY 77X122 PED (DRAPES) ×3 IMPLANT
DRSG GAUZE FLUFF 36X18 (GAUZE/BANDAGES/DRESSINGS) ×3 IMPLANT
DRSG TELFA 4X3 1S NADH ST (GAUZE/BANDAGES/DRESSINGS) ×3 IMPLANT
ELECT CAUTERY NEEDLE TIP 1.0 (MISCELLANEOUS) ×3
ELECT REM PT RETURN 9FT ADLT (ELECTROSURGICAL) ×3
ELECTRODE CAUTERY NEDL TIP 1.0 (MISCELLANEOUS) ×1 IMPLANT
ELECTRODE REM PT RTRN 9FT ADLT (ELECTROSURGICAL) ×1 IMPLANT
GLOVE BIO SURGEON STRL SZ7.5 (GLOVE) ×3 IMPLANT
GOWN STRL REUS W/ TWL LRG LVL3 (GOWN DISPOSABLE) ×2 IMPLANT
GOWN STRL REUS W/TWL LRG LVL3 (GOWN DISPOSABLE) ×2
GOWN STRL REUS W/TWL XL LVL4 (GOWN DISPOSABLE) ×3 IMPLANT
KIT TURNOVER KIT A (KITS) ×3 IMPLANT
LABEL OR SOLS (LABEL) ×1 IMPLANT
NEEDLE HYPO 22GX1.5 SAFETY (NEEDLE) ×2 IMPLANT
NS IRRIG 500ML POUR BTL (IV SOLUTION) ×3 IMPLANT
PACK BASIN MINOR ARMC (MISCELLANEOUS) ×3 IMPLANT
SOL PREP PVP 2OZ (MISCELLANEOUS) ×3
SOLUTION PREP PVP 2OZ (MISCELLANEOUS) ×1 IMPLANT
SUPPORETR ATHLETIC LG (MISCELLANEOUS) ×1 IMPLANT
SUPPORTER ATHLETIC LG (MISCELLANEOUS) ×3
SUT CHROMIC 3 0 SH 27 (SUTURE) ×14 IMPLANT
SYR 10ML LL (SYRINGE) ×2 IMPLANT
SYR 50ML LL SCALE MARK (SYRINGE) ×3 IMPLANT

## 2017-11-24 NOTE — Anesthesia Post-op Follow-up Note (Signed)
Anesthesia QCDR form completed.        

## 2017-11-24 NOTE — Anesthesia Procedure Notes (Signed)
Procedure Name: LMA Insertion Date/Time: 11/24/2017 1:13 PM Performed by: Dava Najjar, CRNA Pre-anesthesia Checklist: Patient identified, Emergency Drugs available, Suction available, Patient being monitored and Timeout performed Patient Re-evaluated:Patient Re-evaluated prior to induction Oxygen Delivery Method: Circle system utilized Preoxygenation: Pre-oxygenation with 100% oxygen Induction Type: IV induction LMA: LMA inserted LMA Size: 4.5 Number of attempts: 1 Placement Confirmation: positive ETCO2 and breath sounds checked- equal and bilateral Tube secured with: Tape Dental Injury: Teeth and Oropharynx as per pre-operative assessment

## 2017-11-24 NOTE — Anesthesia Postprocedure Evaluation (Signed)
Anesthesia Post Note  Patient: Carlos Shepard  Procedure(s) Performed: HYDROCELE EXCISION (Right Groin)  Patient location during evaluation: PACU Anesthesia Type: General Level of consciousness: awake and alert Pain management: pain level controlled Vital Signs Assessment: post-procedure vital signs reviewed and stable Respiratory status: spontaneous breathing, nonlabored ventilation, respiratory function stable and patient connected to nasal cannula oxygen Cardiovascular status: blood pressure returned to baseline and stable Postop Assessment: no apparent nausea or vomiting Anesthetic complications: no     Last Vitals:  Vitals:   11/24/17 1501 11/24/17 1510  BP: (!) 144/78 (!) 156/74  Pulse: 64 63  Resp: 15 20  Temp: (!) 36.2 C (!) 36 C  SpO2: 96% 97%    Last Pain:  Vitals:   11/24/17 1510  TempSrc: Temporal  PainSc: 0-No pain                 Aden Youngman S

## 2017-11-24 NOTE — Transfer of Care (Signed)
Immediate Anesthesia Transfer of Care Note  Patient: Carlos Shepard  Procedure(s) Performed: HYDROCELE EXCISION (Right Groin)  Patient Location: PACU  Anesthesia Type:General  Level of Consciousness: drowsy  Airway & Oxygen Therapy: Patient Spontanous Breathing and Patient connected to face mask oxygen  Post-op Assessment: Report given to RN and Post -op Vital signs reviewed and stable  Post vital signs: Reviewed and stable  Last Vitals:  Vitals Value Taken Time  BP 147/86 11/24/2017  2:16 PM  Temp    Pulse 76 11/24/2017  2:18 PM  Resp 16 11/24/2017  2:18 PM  SpO2 100 % 11/24/2017  2:18 PM  Vitals shown include unvalidated device data.  Last Pain:  Vitals:   11/24/17 1151  TempSrc: Tympanic  PainSc:          Complications: No apparent anesthesia complications

## 2017-11-24 NOTE — Anesthesia Preprocedure Evaluation (Signed)
Anesthesia Evaluation  Patient identified by MRN, date of birth, ID band Patient awake    Reviewed: Allergy & Precautions, H&P , NPO status , Patient's Chart, lab work & pertinent test results  Airway Mallampati: III  TM Distance: >3 FB Neck ROM: full    Dental  (+) Partial Upper, Partial Lower   Pulmonary neg pulmonary ROS, neg shortness of breath, neg COPD,    breath sounds clear to auscultation       Cardiovascular (-) angina+ CAD and + CABG (2001)  (-) Cardiac Stents (-) dysrhythmias  Rhythm:regular Rate:Normal     Neuro/Psych negative neurological ROS  negative psych ROS   GI/Hepatic negative GI ROS, Neg liver ROS, GERD  Medicated and Controlled,  Endo/Other  negative endocrine ROS  Renal/GU      Musculoskeletal   Abdominal   Peds  Hematology negative hematology ROS (+)   Anesthesia Other Findings Past Medical History: No date: Coronary artery disease No date: Elevated cholesterol No date: Environmental and seasonal allergies No date: GERD (gastroesophageal reflux disease)  Past Surgical History: No date: cataract; Left No date: CORONARY ARTERY BYPASS GRAFT No date: HERNIA REPAIR No date: PENILE PROSTHESIS IMPLANT No date: TONSILLECTOMY No date: TURP VAPORIZATION  BMI    Body Mass Index:  23.43 kg/m      Reproductive/Obstetrics negative OB ROS                             Anesthesia Physical Anesthesia Plan  ASA: III  Anesthesia Plan: General LMA   Post-op Pain Management:    Induction:   PONV Risk Score and Plan: 2 and Ondansetron and Dexamethasone  Airway Management Planned:   Additional Equipment:   Intra-op Plan:   Post-operative Plan:   Informed Consent: I have reviewed the patients History and Physical, chart, labs and discussed the procedure including the risks, benefits and alternatives for the proposed anesthesia with the patient or authorized  representative who has indicated his/her understanding and acceptance.   Dental Advisory Given  Plan Discussed with: Anesthesiologist, CRNA and Surgeon  Anesthesia Plan Comments:         Anesthesia Quick Evaluation

## 2017-11-24 NOTE — H&P (Signed)
Date of Initial H&P: 11/17/17 History reviewed, patient examined, no change in status, stable for surgery.

## 2017-11-24 NOTE — Op Note (Signed)
Preoperative diagnosis: Right hydrocele  Postoperative diagnosis: Same  Procedure: Right hydrocelectomy  Surgeon: Suszanne Conners. Evelene Croon MD  Anesthesia: General  Indications:See the history and physical. After informed consent the above procedure(s) were requested     Technique and findings: After adequate general anesthesia had been obtained the perineum was prepped and draped in the usual fashion.  A mid line scrotal raphe incision was made and carried down sharply through the dartos fascia.  The testicle and hydrocele were then delivered through the incision.  The hydrocele was then opened and drained.  The hydrocele lining was then marsupialized posteriorly with a running and locking 3-0 chromic suture.  Bleeders were controlled with electrocautery.  The testicle was then pexed into the scrotum with 3 strategically placed sutures of 3-0 chromic.  Dartos fascia was then closed with running and locking 3-0 chromic suture.  The skin was reapproximated with interrupted 3-0 chromic suture.  Dermabond, dressing, and scrotal supporter were applied.  The sponge, needle, and instrument counts were noted be correct.  Procedure was then terminated and patient transferred to the recovery room in stable condition.

## 2017-11-24 NOTE — Discharge Instructions (Addendum)
Hydrocelectomy, Adult, Care After This sheet gives you information about how to care for yourself after your procedure. Your health care provider may also give you more specific instructions. If you have problems or questions, contact your health care provider. What can I expect after the procedure? After your procedure, it is common to have mild discomfort, swelling, and bruising in the pouch that holds your testicles (scrotum). Follow these instructions at home: Bathing  Ask your health care provider when you can shower, take baths, or go swimming.  If you were told to wear an athletic support strap, take it off when you shower or take a bath. Incision care  Follow instructions from your health care provider about how to take care of your incision. Make sure you: ? Wash your hands with soap and water before you change your bandage (dressing). If soap and water are not available, use hand sanitizer. ? Change your dressing as told by your health care provider. ? Leave stitches (sutures) in place.  Check your incision and scrotum every day for signs of infection. Check for: ? More redness, swelling, or pain. ? Blood or fluid. ? Warmth. ? Pus or a bad smell. Managing pain, stiffness, and swelling  If directed, apply ice to the injured area: ? Put ice in a plastic bag. ? Place a towel between your skin and the bag. ? Leave the ice on for 20 minutes, 2-3 times per day. Driving  Do not drive for 24 hours if you were given a sedative.  Do not drive or use heavy machinery while taking prescription pain medicine.  Ask your health care provider when it is safe to drive. Activity  Do not do any activities that require great strength and energy (are vigorous) for as long as told by your health care provider.  Return to your normal activities as told by your health care provider. Ask your health care provider what activities are safe for you.  Do not lift anything that is heavier than 10  lb (4.5 kg) until your health care provider says that it is safe. General instructions  Take over-the-counter and prescription medicines only as told by your health care provider.  Keep all follow-up visits as told by your health care provider. This is important.  If you were given an athletic support strap, wear it as told by your health care provider.  If you had a drain put in during the procedure, you will need to return for a follow-up visit to have it removed. Contact a health care provider if:  Your pain is not controlled with medicine.  You have more redness or swelling around your scrotum.  You have blood or fluid coming from your scrotum.  Your incision feels warm to the touch.  You have pus or a bad smell coming from your scrotum.  You have a fever. This information is not intended to replace advice given to you by your health care provider. Make sure you discuss any questions you have with your health care provider. Document Released: 11/22/2014 Document Revised: 12/01/2015 Document Reviewed: 12/01/2015 Elsevier Interactive Patient Education  2018 [employer]. Hydrocele, Adult A hydrocele is a collection of fluid in the loose pouch of skin that holds the testicles (scrotum). Usually, it affects only one testicle. What are the causes? This condition may be caused by:  An injury to the scrotum.  An infection.  A tumor or cancer of the testicle.  Twisting of a testicle.  Decreased blood flow to  the scrotum.  What are the signs or symptoms? A hydrocele feels like a water-filled balloon. It may also feel heavy. A hydrocele can cause:  Swelling of the scrotum. The swelling may decrease when you lie down.  Swelling of the groin.  Mild discomfort in the scrotum.  Pain. This can develop if the hydrocele was caused by infection or twisting.  How is this diagnosed? This condition may be diagnosed with a medical history, physical exam, and imaging tests. You may  also have blood and urine tests to check for infection. How is this treated? Treatment may include:  Watching and waiting, particularly if the hydrocele causes no symptoms.  Treatment of the underlying condition. This may include using antibiotic medicine.  Surgery to drain the fluid. Some surgical options include: ? Needle aspiration. For this procedure, a needle is used to drain fluid. ? Hydrocelectomy. For this procedure, an incision is made in the scrotum to remove the fluid sac.  Follow these instructions at home:  Keep all follow-up visits as told by your health care provider. This is important.  Watch the hydrocele for any changes.  Take over-the-counter and prescription medicines only as told by your health care provider.  If you were prescribed an antibiotic medicine, use it as told by your health care provider. Do not stop using the antibiotic even if your condition improves. Contact a health care provider if:  The swelling in your scrotum or groin gets worse.  The hydrocele becomes red, firm, tender to the touch, or painful.  You notice any changes in the hydrocele.  You have a fever. This information is not intended to replace advice given to you by your health care provider. Make sure you discuss any questions you have with your health care provider. Document Released: 08/21/2009 Document Revised: 08/09/2015 Document Reviewed: 02/27/2014 Elsevier Interactive Patient Education  2018 [employer]     AMBULATORY SURGERY  DISCHARGE INSTRUCTIONS   1) The drugs that you were given will stay in your system until tomorrow so for the next 24 hours you should not:  A) Drive an automobile B) Make any legal decisions C) Drink any alcoholic beverage   2) You may resume regular meals tomorrow.  Today it is better to start with liquids and gradually work up to solid foods.  You may eat anything you prefer, but it is better to start with liquids, then soup and  crackers, and gradually work up to solid foods.   3) Please notify your doctor immediately if you have any unusual bleeding, trouble breathing, redness and pain at the surgery site, drainage, fever, or pain not relieved by medication.    4) Additional Instructions:        Please contact your physician with any problems or Same Day Surgery at [phone], Monday through Friday 6 am to 4 pm, or [place] at [facility] number at [phone].

## 2017-11-25 ENCOUNTER — Encounter: Payer: Self-pay | Admitting: Urology

## 2023-07-09 ENCOUNTER — Other Ambulatory Visit: Payer: Self-pay

## 2023-07-09 ENCOUNTER — Emergency Department

## 2023-07-09 ENCOUNTER — Encounter: Payer: Self-pay | Admitting: Emergency Medicine

## 2023-07-09 ENCOUNTER — Emergency Department
Admission: EM | Admit: 2023-07-09 | Discharge: 2023-07-09 | Disposition: A | Discharge status: Home / Self Care | Attending: Emergency Medicine | Admitting: Emergency Medicine

## 2023-07-09 DIAGNOSIS — J208 Acute bronchitis due to other specified organisms: Secondary | ICD-10-CM | POA: Diagnosis not present

## 2023-07-09 DIAGNOSIS — Z951 Presence of aortocoronary bypass graft: Secondary | ICD-10-CM | POA: Insufficient documentation

## 2023-07-09 DIAGNOSIS — I2510 Atherosclerotic heart disease of native coronary artery without angina pectoris: Secondary | ICD-10-CM | POA: Insufficient documentation

## 2023-07-09 DIAGNOSIS — R059 Cough, unspecified: Secondary | ICD-10-CM | POA: Diagnosis present

## 2023-07-09 DIAGNOSIS — B974 Respiratory syncytial virus as the cause of diseases classified elsewhere: Secondary | ICD-10-CM | POA: Insufficient documentation

## 2023-07-09 DIAGNOSIS — J205 Acute bronchitis due to respiratory syncytial virus: Secondary | ICD-10-CM

## 2023-07-09 LAB — CBC
HCT: 37.9 % — ABNORMAL LOW (ref 39.0–52.0)
Hemoglobin: 13.2 g/dL (ref 13.0–17.0)
MCH: 30.3 pg (ref 26.0–34.0)
MCHC: 34.8 g/dL (ref 30.0–36.0)
MCV: 87.1 fL (ref 80.0–100.0)
Platelets: 294 10*3/uL (ref 150–400)
RBC: 4.35 MIL/uL (ref 4.22–5.81)
RDW: 14 % (ref 11.5–15.5)
WBC: 11 10*3/uL — ABNORMAL HIGH (ref 4.0–10.5)
nRBC: 0 % (ref 0.0–0.2)

## 2023-07-09 LAB — BASIC METABOLIC PANEL WITH GFR
Anion gap: 7 (ref 5–15)
BUN: 16 mg/dL (ref 8–23)
CO2: 26 mmol/L (ref 22–32)
Calcium: 8.5 mg/dL — ABNORMAL LOW (ref 8.9–10.3)
Chloride: 103 mmol/L (ref 98–111)
Creatinine, Ser: 1.06 mg/dL (ref 0.61–1.24)
GFR, Estimated: >60 mL/min (ref >60)
Glucose, Bld: 99 mg/dL (ref 70–99)
Potassium: 4.1 mmol/L (ref 3.5–5.1)
Sodium: 136 mmol/L (ref 135–145)

## 2023-07-09 MED ORDER — ACETAMINOPHEN 500 MG PO TABS
1000.0000 mg | ORAL_TABLET | Freq: Once | ORAL | Status: AC
  Administered 2023-07-09: 1000 mg via ORAL
  Filled 2023-07-09: qty 2

## 2023-07-09 NOTE — Discharge Instructions (Signed)
 Your lab test, EKG, and chest x-ray were all reassuring today.  Continue your medications and follow-up with your doctor.

## 2023-07-09 NOTE — ED Provider Notes (Signed)
 [facility] Provider Note    Event Date/Time   First MD Initiated Contact with Patient 07/09/23 1222     (approximate)   History   Chief Complaint: Shortness of Breath   HPI  Carlos Shepard is a 89 y.o. male With a past history of CAD, hyperlipidemia, GERD who comes ED complaining of cough, fatigue for the past week.  Was seen in [facility] 3 days ago, diagnosed with RSV, started on prednisone, inhaler, Tessalon, Augmentin, azithromycin.  Reports that his productive cough has become nonproductive, but he was coughing so much last night that he had trouble sleeping and feels tired today.  No vomiting or diarrhea.  Normal oral intake.  No fever.  Has not taken anti-inflammatories in the last 36 hours.         Past Medical History:  Diagnosis Date   Coronary artery disease    Elevated cholesterol    Environmental and seasonal allergies    GERD (gastroesophageal reflux disease)     Current Outpatient Rx   Order #: [id number] Class: Normal   Order #: [id number] Class: Historical Med   Order #: [id number] Class: Historical Med   Order #: [id number] Class: Normal   Order #: [id number] Class: Historical Med   Order #: [id number] Class: Historical Med   Order #: [id number] Class: Historical Med   Order #: [id number] Class: Historical Med    Past Surgical History:  Procedure Laterality Date   cataract Left    CORONARY ARTERY BYPASS GRAFT     HERNIA REPAIR     HYDROCELE EXCISION Right 11/24/2017   Procedure: HYDROCELE EXCISION;  Surgeon: Rea Cambridge, MD;  Location: [facility] ORS;  Service: Urology;  Laterality: Right;   PENILE PROSTHESIS IMPLANT     TONSILLECTOMY     TURP VAPORIZATION      Physical Exam   Triage Vital Signs: ED Triage Vitals  Encounter Vitals Group     BP 07/09/23 1102 135/71     Systolic BP Percentile --      Diastolic BP Percentile --      Pulse Rate 07/09/23 1102 78     Resp 07/09/23 1102 18     Temp 07/09/23 1102 99.2 F  (37.3 C)     Temp Source 07/09/23 1102 Oral     SpO2 07/09/23 1102 97 %     Weight 07/09/23 1100 163 lb (73.9 kg)     Height 07/09/23 1100 5\' 11"  (1.803 m)     Head Circumference --      Peak Flow --      Pain Score 07/09/23 1059 0     Pain Loc --      Pain Education --      Exclude from Growth Chart --     Most recent vital signs: Vitals:   07/09/23 1102 07/09/23 1220  BP: 135/71 (!) 143/76  Pulse: 78 65  Resp: 18 18  Temp: 99.2 F (37.3 C)   SpO2: 97% 97%    General: Awake, no distress.  CV:  Good peripheral perfusion.  Regular rate rhythm Resp:  Normal effort.  Clear to auscultation bilaterally.  No wheezing or cough with FEV1 maneuver Abd:  No distention.  Soft nontender Other:  No lower extremity edema   ED Results / Procedures / Treatments   Labs (all labs ordered are listed, but only abnormal results are displayed) Labs Reviewed  CBC - Abnormal; Notable for the following components:      Result Value   WBC 11.0 (*)  HCT 37.9 (*)    All other components within normal limits  BASIC METABOLIC PANEL WITH GFR - Abnormal; Notable for the following components:   Calcium 8.5 (*)    All other components within normal limits     EKG Interpreted by me Sinus rhythm rate of 77.  Normal axis intervals QRS ST segments and T waves   RADIOLOGY Chest x-ray interpreted by me, no pneumonia or consolidation.  Radiology report reviewed   PROCEDURES:  Procedures   MEDICATIONS ORDERED IN ED: Medications  acetaminophen  (TYLENOL ) tablet 1,000 mg (has no administration in time range)     IMPRESSION / MDM / ASSESSMENT AND PLAN / ED COURSE  I reviewed the triage vital signs and the nursing notes.  DDx: Pneumonia, pleural effusion, pneumothorax, viral bronchitis, electrolyte derangement, AKI, dehydration.  Doubt ACS PE dissection or pericardial effusion  Patient's presentation is most consistent with acute presentation with potential threat to life or bodily  function.  Patient comes to the ED for reassessment due to persistent symptoms related to RSV bronchitis.  He has been taking antibiotics prophylactically over the last few days, and exam chest x-ray and vitals are all normal.  Labs are normal.  Recommended starting Tylenol , continue medications and supportive care at home.       FINAL CLINICAL IMPRESSION(S) / ED DIAGNOSES   Final diagnoses:  RSV bronchitis     Rx / DC Orders   ED Discharge Orders     None        Note:  This document was prepared using Dragon voice recognition software and may include unintentional dictation errors.   Jacquie Maudlin, MD 07/09/23 1247

## 2023-07-09 NOTE — ED Triage Notes (Signed)
 Pt via POV from home. Pt was dx with RSV 2 days ago, reports that he has not been feeling any better. States he has been taking medication as prescribed. Denies pain. Reports cough, SOB, and weakness. Denies hx of COPD/CHF. Pt is A&Ox4 and NAD, ambulatory to triage.
